# Patient Record
Sex: Male | Born: 1975
Health system: Southern US, Community
[De-identification: ages and names within clinical notes are randomized; demographics above are authoritative.]

## PROBLEM LIST (undated history)

## (undated) DIAGNOSIS — E079 Disorder of thyroid, unspecified: Secondary | ICD-10-CM

## (undated) DIAGNOSIS — I10 Essential (primary) hypertension: Secondary | ICD-10-CM

## (undated) HISTORY — DX: Essential (primary) hypertension: I10

## (undated) HISTORY — PX: OTHER SURGICAL HISTORY: SHX169

## (undated) HISTORY — DX: Disorder of thyroid, unspecified: E07.9

---

## 1998-02-27 ENCOUNTER — Emergency Department (HOSPITAL_COMMUNITY): Admission: EM | Admit: 1998-02-27 | Discharge: 1998-02-28 | Payer: Self-pay

## 1999-01-15 ENCOUNTER — Emergency Department (HOSPITAL_COMMUNITY): Admission: EM | Admit: 1999-01-15 | Discharge: 1999-01-15 | Payer: Self-pay | Admitting: Internal Medicine

## 1999-03-11 ENCOUNTER — Emergency Department (HOSPITAL_COMMUNITY): Admission: EM | Admit: 1999-03-11 | Discharge: 1999-03-11 | Payer: Self-pay

## 2001-11-27 ENCOUNTER — Encounter: Admission: RE | Admit: 2001-11-27 | Discharge: 2001-11-27 | Payer: Self-pay | Admitting: Family Medicine

## 2001-11-27 ENCOUNTER — Encounter: Payer: Self-pay | Admitting: Family Medicine

## 2004-07-18 ENCOUNTER — Encounter: Admission: RE | Admit: 2004-07-18 | Discharge: 2004-07-18 | Payer: Self-pay | Admitting: Family Medicine

## 2007-07-25 ENCOUNTER — Emergency Department (HOSPITAL_COMMUNITY): Admission: EM | Admit: 2007-07-25 | Discharge: 2007-07-25 | Payer: Self-pay | Admitting: Emergency Medicine

## 2008-09-06 ENCOUNTER — Emergency Department (HOSPITAL_COMMUNITY): Admission: EM | Admit: 2008-09-06 | Discharge: 2008-09-06 | Payer: Self-pay | Admitting: Emergency Medicine

## 2009-01-30 ENCOUNTER — Emergency Department (HOSPITAL_COMMUNITY): Admission: EM | Admit: 2009-01-30 | Discharge: 2009-01-30 | Payer: Self-pay | Admitting: Emergency Medicine

## 2010-01-08 ENCOUNTER — Encounter: Admission: RE | Admit: 2010-01-08 | Discharge: 2010-01-08 | Payer: Self-pay | Admitting: Internal Medicine

## 2010-02-08 ENCOUNTER — Encounter: Payer: Self-pay | Admitting: Sports Medicine

## 2010-02-27 ENCOUNTER — Ambulatory Visit: Payer: Self-pay | Admitting: Family Medicine

## 2010-02-27 DIAGNOSIS — M25569 Pain in unspecified knee: Secondary | ICD-10-CM

## 2010-02-27 DIAGNOSIS — G90529 Complex regional pain syndrome I of unspecified lower limb: Secondary | ICD-10-CM | POA: Insufficient documentation

## 2010-03-30 ENCOUNTER — Ambulatory Visit: Payer: Self-pay | Admitting: Family Medicine

## 2010-03-30 DIAGNOSIS — IMO0002 Reserved for concepts with insufficient information to code with codable children: Secondary | ICD-10-CM

## 2010-08-14 NOTE — Assessment & Plan Note (Signed)
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 he still has pain medially below knee cap. Is not keeping him from doing anything--can run etc without having to stop. Does have pretty contstant pain at this specific site. No worse with activity.  Also has some tingling sensations at times in his B lower legs, R>L, Especially bad if he stretches with his foot on wall (hamstring stretch0--thn it seems to go almost totally numvb--resolves when he stops. No new injuries.  Current Medications (verified): 1)  Amitriptyline Hcl 50 Mg Tabs (Amitriptyline Hcl) .Marland Kitchen.. 1 By Mouth At Bedtime  Allergies (verified): No Known Drug Allergies  Review of Systems  The patient denies anorexia, fever, weight loss, and weight gain.         Please see HPI for additional ROS.   Physical Exam  General:  alert and well-developed.   Msk:  Rigt quad normal bulk muscle and tone--strength in  knee extesnion 5/5. No knee effusion. Ligamentously intact. No patellar apprehension. TTP anserine bursa area and palpation here reproduces his pain. Negative mcMurray  [place]orea (anserine bursa on right is enlarged, fluid filled, one or two small calcification seen).. Compared with anserine bursa site on left which is flat, minimla fluid. Additional Exam:  Patient given informed consent for injection. Discussed possible complications of infection, bleeding or skin atrophy at site of injection. Possible side effect of avascular necro1-- cc kennalog plus 1----cc 1% lidocaine without epinephrine was injected into the-rigt answerine bursa--. Patient tolerated procedure well with no complications.    Impression & Recommendations:  Problem # 1:   ANSERINE BURSITIS, RIGHT (ICD-726.61)  Orders: Joint Aspirate / Injection, Small (45409) Kenalog 10 mg inj (J3301) rtc 2 weeks no restrictions if not resolved them, consider further imaging  Complete Medication List: 1)  Amitriptyline Hcl 50 Mg Tabs (Amitriptyline hcl) .Marland Kitchen.. 1 by mouth at bedtime

## 2010-08-14 NOTE — Letter (Signed)
Summary: [facility]  [place] [facility]   Imported By: Marily Memos 02/12/2010 10:16:31  _____________________________________________________________________  External Attachment:    Type:   Image     Comment:   External Document
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
## 2010-11-23 ENCOUNTER — Other Ambulatory Visit: Payer: Self-pay | Admitting: Occupational Medicine

## 2010-11-23 ENCOUNTER — Ambulatory Visit
Admission: RE | Admit: 2010-11-23 | Discharge: 2010-11-23 | Disposition: A | Discharge status: Home / Self Care | Payer: Self-pay | Source: Ambulatory Visit | Attending: Occupational Medicine | Admitting: Occupational Medicine

## 2010-11-23 DIAGNOSIS — S60229A Contusion of unspecified hand, initial encounter: Secondary | ICD-10-CM

## 2010-12-08 ENCOUNTER — Emergency Department (HOSPITAL_COMMUNITY)
Admission: EM | Admit: 2010-12-08 | Discharge: 2010-12-08 | Disposition: A | Discharge status: Home / Self Care | Payer: Worker's Compensation | Attending: Emergency Medicine | Admitting: Emergency Medicine

## 2010-12-08 ENCOUNTER — Emergency Department (HOSPITAL_COMMUNITY): Payer: Worker's Compensation

## 2010-12-08 DIAGNOSIS — W19XXXA Unspecified fall, initial encounter: Secondary | ICD-10-CM | POA: Insufficient documentation

## 2010-12-08 DIAGNOSIS — S46909A Unspecified injury of unspecified muscle, fascia and tendon at shoulder and upper arm level, unspecified arm, initial encounter: Secondary | ICD-10-CM | POA: Insufficient documentation

## 2010-12-08 DIAGNOSIS — Y990 Civilian activity done for income or pay: Secondary | ICD-10-CM | POA: Insufficient documentation

## 2010-12-08 DIAGNOSIS — S4980XA Other specified injuries of shoulder and upper arm, unspecified arm, initial encounter: Secondary | ICD-10-CM | POA: Insufficient documentation

## 2010-12-08 DIAGNOSIS — E039 Hypothyroidism, unspecified: Secondary | ICD-10-CM | POA: Insufficient documentation

## 2010-12-08 DIAGNOSIS — Z79899 Other long term (current) drug therapy: Secondary | ICD-10-CM | POA: Insufficient documentation

## 2010-12-08 DIAGNOSIS — IMO0002 Reserved for concepts with insufficient information to code with codable children: Secondary | ICD-10-CM | POA: Insufficient documentation

## 2010-12-08 DIAGNOSIS — M25519 Pain in unspecified shoulder: Secondary | ICD-10-CM | POA: Insufficient documentation

## 2010-12-26 ENCOUNTER — Ambulatory Visit (INDEPENDENT_AMBULATORY_CARE_PROVIDER_SITE_OTHER): Payer: Worker's Compensation | Admitting: Family Medicine

## 2010-12-26 ENCOUNTER — Encounter: Payer: Self-pay | Admitting: Family Medicine

## 2010-12-26 VITALS — BP 130/84 | Ht 71.0 in | Wt 182.0 lb

## 2010-12-26 DIAGNOSIS — M25519 Pain in unspecified shoulder: Secondary | ICD-10-CM

## 2010-12-26 DIAGNOSIS — M25511 Pain in right shoulder: Secondary | ICD-10-CM

## 2010-12-26 MED ORDER — MELOXICAM 15 MG PO TABS: 15.0000 mg | ORAL_TABLET | Freq: Every day | ORAL | Status: AC

## 2010-12-27 ENCOUNTER — Ambulatory Visit (HOSPITAL_COMMUNITY)
Admission: RE | Admit: 2010-12-27 | Discharge: 2010-12-27 | Disposition: A | Discharge status: Home / Self Care | Payer: Worker's Compensation | Source: Ambulatory Visit | Attending: Family Medicine | Admitting: Family Medicine

## 2010-12-27 ENCOUNTER — Other Ambulatory Visit: Payer: Self-pay | Admitting: Family Medicine

## 2010-12-27 DIAGNOSIS — M25511 Pain in right shoulder: Secondary | ICD-10-CM

## 2010-12-27 DIAGNOSIS — R0789 Other chest pain: Secondary | ICD-10-CM | POA: Insufficient documentation

## 2010-12-27 DIAGNOSIS — M25519 Pain in unspecified shoulder: Secondary | ICD-10-CM | POA: Insufficient documentation
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
## 2011-01-14 ENCOUNTER — Ambulatory Visit (INDEPENDENT_AMBULATORY_CARE_PROVIDER_SITE_OTHER): Payer: Worker's Compensation | Admitting: Sports Medicine

## 2011-01-14 ENCOUNTER — Encounter: Payer: Self-pay | Admitting: Sports Medicine

## 2011-01-14 VITALS — BP 135/90 | HR 56 | Ht 71.0 in | Wt 175.0 lb

## 2011-01-14 DIAGNOSIS — M7521 Bicipital tendinitis, right shoulder: Secondary | ICD-10-CM | POA: Insufficient documentation

## 2011-01-14 DIAGNOSIS — M7520 Bicipital tendinitis, unspecified shoulder: Secondary | ICD-10-CM

## 2011-01-14 DIAGNOSIS — M25511 Pain in right shoulder: Secondary | ICD-10-CM

## 2011-01-14 DIAGNOSIS — M25519 Pain in unspecified shoulder: Secondary | ICD-10-CM

## 2011-01-14 NOTE — Assessment & Plan Note (Signed)
I think he is making excellent progress and is now off of any regular medications. His pain is relieved with ibuprofen I would ask him to continue physical therapy He is to remain on light duty but should go ahead and test summary work-related activities to see if they create any pain  I would like to recheck him in 6 weeks and at that point I would hope he is ready to return to full activity

## 2011-01-14 NOTE — Assessment & Plan Note (Signed)
There appears to be some element of bicipital tendinitis with a positive exam and with some edema on his scan  I would wonder if this has been compensatory as it does not seem that the top of fall would create this injury  This is mild but I would like him to at some light biceps curls, straight arm raises and forearm rotations to see if he can get this to resolve faster  Copies of this note to Dr. Kathrine Haddock at occupational health and to [facility]
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
## 2011-01-14 NOTE — Patient Instructions (Signed)
Continue Physical therapy  Follow up in 6 weeks  Try normal motions that you would do at work to determine readiness to return to full duty before next appointment.

## 2011-02-25 ENCOUNTER — Ambulatory Visit (INDEPENDENT_AMBULATORY_CARE_PROVIDER_SITE_OTHER): Payer: Worker's Compensation | Admitting: Sports Medicine

## 2011-02-25 VITALS — BP 146/92

## 2011-02-25 DIAGNOSIS — M7521 Bicipital tendinitis, right shoulder: Secondary | ICD-10-CM

## 2011-02-25 DIAGNOSIS — M25519 Pain in unspecified shoulder: Secondary | ICD-10-CM

## 2011-02-25 DIAGNOSIS — M7520 Bicipital tendinitis, unspecified shoulder: Secondary | ICD-10-CM

## 2011-02-25 DIAGNOSIS — M25511 Pain in right shoulder: Secondary | ICD-10-CM

## 2011-02-25 NOTE — Progress Notes (Signed)
  Subjective:    Patient ID: Dean Young, male    DOB: [birth year], 35 y.o.   MRN: [id number]  HPI 35 y/o male is here for follow up for right shoulder pain following falling on his shoulder from 6 feet.  His pain is mostly resolved.  He has been back to full duty for 2 weeks.  He was released from physical therapy and is now working out in the gym twice weekly.  He doesn't require any medications.   Review of Systems     Objective:   Physical Exam  Right shoulder: Forward flexion to 180, without pain Abduction to 180, without pain Strength in rotator cuff is normal Yergason's and Speeds negative Hawkin's is equivocal   No pain or weakness with modified testing of the biceps  Some weakness but no pain with modified testing of the supraspinatus        Assessment & Plan:

## 2011-02-25 NOTE — Assessment & Plan Note (Addendum)
His pain is mostly resolved.  He has normal ROM.  His strength is normal with the exception of a minus 30% overhead.  We expect this to return to normal in 4-6 weeks without medical intervention.  He will follow up PRN.  He has reached a point of Maximal Medical Improvement from his current injury and is released to full activity.

## 2011-02-25 NOTE — Assessment & Plan Note (Signed)
The pain has resolved and his strength is normal.  He has reached maximal medical improvement and is released to full duty.  His follow up is PRN.

## 2011-02-25 NOTE — Patient Instructions (Signed)
1.  Add some overhead strengthening exercises with dumb bells.  2. You are at maximal medical improvement right now so you are able to continue with full duty.  3. Follow up if your pain returns.

## 2011-05-10 ENCOUNTER — Emergency Department (HOSPITAL_COMMUNITY)
Admission: EM | Admit: 2011-05-10 | Discharge: 2011-05-10 | Disposition: A | Discharge status: Home / Self Care | Payer: Worker's Compensation | Attending: Emergency Medicine | Admitting: Emergency Medicine

## 2011-05-10 ENCOUNTER — Emergency Department (HOSPITAL_COMMUNITY): Payer: Worker's Compensation

## 2011-05-10 DIAGNOSIS — S8000XA Contusion of unspecified knee, initial encounter: Secondary | ICD-10-CM | POA: Insufficient documentation

## 2011-05-10 DIAGNOSIS — Y990 Civilian activity done for income or pay: Secondary | ICD-10-CM | POA: Insufficient documentation

## 2011-05-10 DIAGNOSIS — Z79899 Other long term (current) drug therapy: Secondary | ICD-10-CM | POA: Insufficient documentation

## 2011-05-10 DIAGNOSIS — M25569 Pain in unspecified knee: Secondary | ICD-10-CM | POA: Insufficient documentation

## 2011-05-10 DIAGNOSIS — E039 Hypothyroidism, unspecified: Secondary | ICD-10-CM | POA: Insufficient documentation

## 2011-05-10 DIAGNOSIS — IMO0002 Reserved for concepts with insufficient information to code with codable children: Secondary | ICD-10-CM | POA: Insufficient documentation

## 2011-05-30 ENCOUNTER — Ambulatory Visit (INDEPENDENT_AMBULATORY_CARE_PROVIDER_SITE_OTHER): Payer: Worker's Compensation | Admitting: Family Medicine

## 2011-05-30 VITALS — BP 130/88

## 2011-05-30 DIAGNOSIS — G90529 Complex regional pain syndrome I of unspecified lower limb: Secondary | ICD-10-CM

## 2011-05-30 DIAGNOSIS — M25569 Pain in unspecified knee: Secondary | ICD-10-CM

## 2011-05-30 MED ORDER — AMITRIPTYLINE HCL 25 MG PO TABS: 25.0000 mg | ORAL_TABLET | Freq: Every day | ORAL | Status: AC
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
## 2011-05-30 NOTE — Patient Instructions (Signed)
Our office will be checking with the city about your knee MRI and call you.

## 2011-06-03 ENCOUNTER — Encounter: Payer: Self-pay | Admitting: *Deleted

## 2011-06-03 NOTE — Progress Notes (Signed)
MRI order and office notes faxed to Noralee Chars at the [facility].  Called Liborio Nixon and she states she will schedule appt.

## 2011-07-30 ENCOUNTER — Ambulatory Visit (INDEPENDENT_AMBULATORY_CARE_PROVIDER_SITE_OTHER): Payer: Worker's Compensation | Admitting: Family Medicine

## 2011-07-30 ENCOUNTER — Encounter: Payer: Self-pay | Admitting: Family Medicine

## 2011-07-30 VITALS — BP 133/95 | HR 108

## 2011-07-30 DIAGNOSIS — M25561 Pain in right knee: Secondary | ICD-10-CM

## 2011-07-30 DIAGNOSIS — S83289A Other tear of lateral meniscus, current injury, unspecified knee, initial encounter: Secondary | ICD-10-CM

## 2011-07-30 DIAGNOSIS — M25569 Pain in unspecified knee: Secondary | ICD-10-CM
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
## 2015-01-03 ENCOUNTER — Other Ambulatory Visit: Payer: Self-pay | Admitting: Occupational Medicine

## 2015-01-03 ENCOUNTER — Ambulatory Visit
Admission: RE | Admit: 2015-01-03 | Discharge: 2015-01-03 | Disposition: A | Discharge status: Home / Self Care | Payer: No Typology Code available for payment source | Source: Ambulatory Visit | Attending: Occupational Medicine | Admitting: Occupational Medicine

## 2015-01-03 DIAGNOSIS — Z021 Encounter for pre-employment examination: Secondary | ICD-10-CM

## 2016-11-06 DIAGNOSIS — J301 Allergic rhinitis due to pollen: Secondary | ICD-10-CM | POA: Diagnosis not present

## 2016-11-06 DIAGNOSIS — J3081 Allergic rhinitis due to animal (cat) (dog) hair and dander: Secondary | ICD-10-CM | POA: Diagnosis not present

## 2016-11-06 DIAGNOSIS — J3089 Other allergic rhinitis: Secondary | ICD-10-CM | POA: Diagnosis not present

## 2016-11-18 DIAGNOSIS — E039 Hypothyroidism, unspecified: Secondary | ICD-10-CM | POA: Diagnosis not present

## 2016-11-18 DIAGNOSIS — E7800 Pure hypercholesterolemia, unspecified: Secondary | ICD-10-CM | POA: Diagnosis not present

## 2016-12-12 ENCOUNTER — Emergency Department (HOSPITAL_COMMUNITY)
Admission: EM | Admit: 2016-12-12 | Discharge: 2016-12-12 | Disposition: A | Discharge status: Home / Self Care | Payer: Worker's Compensation | Attending: Emergency Medicine | Admitting: Emergency Medicine

## 2016-12-12 DIAGNOSIS — I10 Essential (primary) hypertension: Secondary | ICD-10-CM

## 2016-12-12 LAB — I-STAT CHEM 8, ED
BUN: 25 mg/dL — ABNORMAL HIGH (ref 6–20)
CALCIUM ION: 1.11 mmol/L — AB (ref 1.15–1.40)
Chloride: 105 mmol/L (ref 101–111)
Creatinine, Ser: 1.2 mg/dL (ref 0.61–1.24)
Glucose, Bld: 85 mg/dL (ref 65–99)
HCT: 37 % — ABNORMAL LOW (ref 39.0–52.0)
HEMOGLOBIN: 12.6 g/dL — AB (ref 13.0–17.0)
POTASSIUM: 3.4 mmol/L — AB (ref 3.5–5.1)
SODIUM: 140 mmol/L (ref 135–145)
TCO2: 26 mmol/L (ref 0–100)

## 2016-12-12 NOTE — ED Provider Notes (Signed)
[facility]-EMERGENCY DEPT Provider Note   CSN: [id number] Arrival date & time: 12/12/16  1919  By signing my name below, I, Modena Jansky, attest that this documentation has been prepared under the direction and in the presence of Linwood Dibbles, MD. Electronically Signed: Modena Jansky, Scribe. 12/12/2016. 7:57 PM.  History   Chief Complaint Chief Complaint  Patient presents with  . Hypertension   The history is provided by the patient. No language interpreter was used.    HPI Comments: Dean Young is a 41 y.o. male who presents to the Emergency Department complaining of HTN that started today. He states he had a reading of 200/120 today. His blood pressure in the ED today was 133/100. No modifying factors. He has a family hx of HTN. Denies any chest pain, SOB, vomiting, or other complaints at this time.  No past medical history on file.  Patient Active Problem List   Diagnosis Date Noted  . Bicipital tendinitis of right shoulder 01/14/2011  . Shoulder pain, right 12/26/2010  . ANSERINE BURSITIS, RIGHT 03/30/2010  . REFLEX SYMPATHETIC DYSTROPHY OF THE LOWER LIMB 02/27/2010  . KNEE PAIN, RIGHT 02/27/2010    No past surgical history on file.     Home Medications    Prior to Admission medications   Medication Sig Start Date End Date Taking? Authorizing Provider  amitriptyline (ELAVIL) 25 MG tablet Take 1 tablet (25 mg total) by mouth at bedtime. 05/30/11 05/29/12  Hannah Beat, MD  EPIPEN 2-PAK 0.3 MG/0.3ML DEVI  11/09/10   [provider]  levocetirizine (XYZAL) 5 MG tablet Take 5 mg by mouth daily. 12/26/10   [provider]  levothyroxine (SYNTHROID, LEVOTHROID) 100 MCG tablet Take 100 mcg by mouth daily. 10/26/10   [provider]  VYVANSE 30 MG capsule Take 30 mg by mouth daily. 12/10/10   [provider]    Family History No family history on file.  Social History Social History  Substance Use Topics  . Smoking status: Never Smoker   . Smokeless tobacco: Never Used  . Alcohol use Not on file     Allergies   Patient has no known allergies.   Review of Systems Review of Systems  Respiratory: Negative for shortness of breath.   Cardiovascular: Negative for chest pain.  Gastrointestinal: Negative for vomiting.     Physical Exam Updated Vital Signs BP (!) 133/100   Pulse 72   Temp 98.4 F (36.9 C)   Resp 18   SpO2 97%   Physical Exam  Constitutional: He appears well-developed and well-nourished. No distress.  HENT:  Head: Normocephalic and atraumatic.  Right Ear: External ear normal.  Left Ear: External ear normal.  Eyes: Conjunctivae are normal. Right eye exhibits no discharge. Left eye exhibits no discharge. No scleral icterus.  Neck: Neck supple. No tracheal deviation present.  Cardiovascular: Normal rate.   Pulmonary/Chest: Effort normal. No stridor. No respiratory distress.  Abdominal: He exhibits no distension.  Musculoskeletal: He exhibits no edema.  Neurological: He is alert. Cranial nerve deficit: no gross deficits.  Skin: Skin is warm and dry. No rash noted.  Psychiatric: He has a normal mood and affect.  Nursing note and vitals reviewed.    ED Treatments / Results  DIAGNOSTIC STUDIES: Oxygen Saturation is 97% on RA, normal by my interpretation.    COORDINATION OF CARE: 8:01 PM- Pt advised of plan for treatment and pt agrees.  Labs (all labs ordered are listed, but only abnormal results are displayed) Labs Reviewed  I-STAT CHEM 8, ED - Abnormal; Notable for the following:       Result Value   Potassium 3.4 (*)    BUN 25 (*)    Calcium, Ion 1.11 (*)    Hemoglobin 12.6 (*)    HCT 37.0 (*)    All other components within normal limits    EKG  EKG Interpretation  Date/Time:  Thursday Dec 12 2016 20:12:11 EDT Ventricular Rate:  64 PR Interval:  144 QRS Duration: 110 QT Interval:  426 QTC Calculation: 439 R Axis:   27 Text Interpretation:  Normal sinus rhythm Normal ECG  No old tracing to compare Confirmed by Linwood DibblesKnapp, Joshawn Crissman [phone] on 12/12/2016 8:19:04 PM        Procedures Procedures (including critical care time)  Medications Ordered in ED Medications - No data to display   Initial Impression / Assessment and Plan / ED Course  I have reviewed the triage vital signs and the nursing notes.  Pertinent labs & imaging results that were available during my care of the patient were reviewed by me and considered in my medical decision making (see chart for details).   patient presented to the emergency room for evaluation of hypertension. Patient was at work today when a paramedic took his blood pressure and it was 170/110. Patient denies having any particular stress or anxiety at that time. He was asymptomatic. He denied any trouble with chest pain or shortness of breath. In the emergency room his blood pressure was much better at initially 133/100. Repeat bilateral blood pressures show 147/93 and 138/91. I don't think he needs to start any medical treatment at this time. I recommend he start taking his blood pressure regularly at least a few times a week and keeping a record of it. I asked him to make a follow-up appointment with his primary care doctor. Warning signs and precautions were discussed.  Final Clinical Impressions(s) / ED Diagnoses   Final diagnoses:  Hypertension, unspecified type    New Prescriptions New Prescriptions   No medications on file  I personally performed the services described in this documentation, which was scribed in my presence.  The recorded information has been reviewed and is accurate.    Linwood DibblesKnapp, Trinh Sanjose, MD 12/12/16 2101

## 2016-12-12 NOTE — Discharge Instructions (Signed)
Start taking your blood pressure a few times per week.  Keep a record of this and follow up with your primary care doctor to review the numbers.  Return to the ED for chest pain, shortness of breath or other concerning symptoms

## 2016-12-12 NOTE — ED Triage Notes (Signed)
Pt presents with EMS; pt states he was checking Bp periodically and today BP was 170/110; pt denies hx of HTN but states it  Runs in family; Pt denies any sx or pan on arrival ;pt a&ox 4

## 2016-12-13 DIAGNOSIS — I10 Essential (primary) hypertension: Secondary | ICD-10-CM | POA: Diagnosis not present

## 2017-01-21 DIAGNOSIS — I10 Essential (primary) hypertension: Secondary | ICD-10-CM | POA: Diagnosis not present

## 2017-12-03 DIAGNOSIS — J301 Allergic rhinitis due to pollen: Secondary | ICD-10-CM | POA: Diagnosis not present

## 2017-12-03 DIAGNOSIS — J3081 Allergic rhinitis due to animal (cat) (dog) hair and dander: Secondary | ICD-10-CM | POA: Diagnosis not present

## 2017-12-03 DIAGNOSIS — J3089 Other allergic rhinitis: Secondary | ICD-10-CM | POA: Diagnosis not present

## 2017-12-23 DIAGNOSIS — I10 Essential (primary) hypertension: Secondary | ICD-10-CM | POA: Diagnosis not present

## 2017-12-23 DIAGNOSIS — E7800 Pure hypercholesterolemia, unspecified: Secondary | ICD-10-CM | POA: Diagnosis not present

## 2017-12-23 DIAGNOSIS — E039 Hypothyroidism, unspecified: Secondary | ICD-10-CM | POA: Diagnosis not present

## 2018-03-31 DIAGNOSIS — I83812 Varicose veins of left lower extremities with pain: Secondary | ICD-10-CM | POA: Diagnosis not present

## 2018-03-31 DIAGNOSIS — Z6827 Body mass index (BMI) 27.0-27.9, adult: Secondary | ICD-10-CM | POA: Diagnosis not present

## 2018-04-08 ENCOUNTER — Other Ambulatory Visit: Payer: Self-pay

## 2018-04-08 DIAGNOSIS — I83892 Varicose veins of left lower extremities with other complications: Secondary | ICD-10-CM

## 2018-04-13 ENCOUNTER — Other Ambulatory Visit: Payer: Self-pay

## 2018-04-23 ENCOUNTER — Ambulatory Visit (INDEPENDENT_AMBULATORY_CARE_PROVIDER_SITE_OTHER): Payer: 59 | Admitting: Vascular Surgery

## 2018-04-23 ENCOUNTER — Encounter: Payer: Self-pay | Admitting: Vascular Surgery

## 2018-04-23 ENCOUNTER — Ambulatory Visit (HOSPITAL_COMMUNITY)
Admission: RE | Admit: 2018-04-23 | Discharge: 2018-04-23 | Disposition: A | Discharge status: Home / Self Care | Payer: 59 | Source: Ambulatory Visit | Attending: Vascular Surgery | Admitting: Vascular Surgery

## 2018-04-23 VITALS — BP 128/85 | HR 57 | Temp 98.0°F | Resp 16 | Ht 71.0 in | Wt 195.0 lb

## 2018-04-23 DIAGNOSIS — I83812 Varicose veins of left lower extremities with pain: Secondary | ICD-10-CM | POA: Diagnosis not present

## 2018-04-23 DIAGNOSIS — I83892 Varicose veins of left lower extremities with other complications: Secondary | ICD-10-CM | POA: Diagnosis not present

## 2018-04-23 NOTE — Progress Notes (Signed)
REASON FOR CONSULT:    Painful varicose veins of the left lower extremity.  The consult is requested by Dr. Tenny Craw.  HPI:   Dean Young is a pleasant 42 y.o. male, who is referred for evaluation of a painful varicose vein in the left lower extremity.  I have reviewed the records that was sent with the patient.  The patient has previously been seen by 1 of the other vein centers in 2015 with chronic venous insufficiency.  Patient was most recently seen by Dr. Hyacinth Meeker on 03/31/2018.  He had some painful varicose veins of the left lower extremity and is set up for vascular consultation.  The patient describes some aching pain and heaviness associated with the varicose veins along the anterior lateral aspect of his left leg.  Symptoms are aggravated by standing and sitting and relieved with elevation.  He does not wear compression stockings.  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  He is unaware of any previous history of DVT or phlebitis.  Past Medical History:  Diagnosis Date  . Hypertension   . Thyroid disease     Family History  Problem Relation Age of Onset  . Heart disease Mother   . Hyperlipidemia Mother   . Hypertension Mother   . Obesity Mother   . Cancer Sister     SOCIAL HISTORY: Social History   Socioeconomic History  . Marital status: Married    Spouse name: Not on file  . Number of children: Not on file  . Years of education: Not on file  . Highest education level: Not on file  Occupational History  . Not on file  Social Needs  . Financial resource strain: Not on file  . Food insecurity:    Worry: Not on file    Inability: Not on file  . Transportation needs:    Medical: Not on file    Non-medical: Not on file  Tobacco Use  . Smoking status: Never Smoker  . Smokeless tobacco: Never Used  Substance and Sexual Activity  . Alcohol use: Yes    Alcohol/week: 4.0 standard drinks    Types: 2 Cans of beer, 2 Shots of liquor per week  .  Drug use: Never  . Sexual activity: Yes  Lifestyle  . Physical activity:    Days per week: Not on file    Minutes per session: Not on file  . Stress: Not on file  Relationships  . Social connections:    Talks on phone: Not on file    Gets together: Not on file    Attends religious service: Not on file    Active member of club or organization: Not on file    Attends meetings of clubs or organizations: Not on file    Relationship status: Not on file  . Intimate partner violence:    Fear of current or ex partner: Not on file    Emotionally abused: Not on file    Physically abused: Not on file    Forced sexual activity: Not on file  Other Topics Concern  . Not on file  Social History Narrative  . Not on file    No Known Allergies  Current Outpatient Medications  Medication Sig Dispense Refill  . EPIPEN 2-PAK 0.3 MG/0.3ML DEVI     . levocetirizine (XYZAL) 5 MG tablet Take 5 mg by mouth daily.    Marland Kitchen levothyroxine (SYNTHROID, LEVOTHROID) 100 MCG tablet Take 100 mcg by mouth daily.    Marland Kitchen  amitriptyline (ELAVIL) 25 MG tablet Take 1 tablet (25 mg total) by mouth at bedtime. 30 tablet 3  . VYVANSE 30 MG capsule Take 30 mg by mouth daily.     No current facility-administered medications for this visit.     REVIEW OF SYSTEMS:  [X]  denotes positive finding, [ ]  denotes negative finding Cardiac  Comments:  Chest pain or chest pressure:    Shortness of breath upon exertion:    Short of breath when lying flat:    Irregular heart rhythm:        Vascular    Pain in calf, thigh, or hip brought on by ambulation: x   Pain in feet at night that wakes you up from your sleep:     Blood clot in your veins:    Leg swelling:         Pulmonary    Oxygen at home:    Productive cough:     Wheezing:         Neurologic    Sudden weakness in arms or legs:     Sudden numbness in arms or legs:     Sudden onset of difficulty speaking or slurred speech:    Temporary loss of vision in one eye:      Problems with dizziness:         Gastrointestinal    Blood in stool:     Vomited blood:         Genitourinary    Burning when urinating:     Blood in urine:        Psychiatric    Major depression:         Hematologic    Bleeding problems:    Problems with blood clotting too easily:        Skin    Rashes or ulcers:        Constitutional    Fever or chills:     PHYSICAL EXAM:   Vitals:   04/23/18 1450  BP: 128/85  Pulse: (!) 57  Resp: 16  Temp: 98 F (36.7 C)  SpO2: 99%  Weight: 195 lb (88.5 kg)  Height: 5\' 11"  (1.803 m)   GENERAL: The patient is a well-nourished male, in no acute distress. The vital signs are documented above. CARDIAC: There is a regular rate and rhythm.  VASCULAR: I do not detect carotid bruits. He has palpable femoral and pedal pulses bilaterally. He has no significant lower extremity swelling.  He has 1 varicose vein along the anterior lateral aspect of his left leg likely where there is an underlying perforator that penetrates the fascia.  There is no evidence of phlebitis. PULMONARY: There is good air exchange bilaterally without wheezing or rales. ABDOMEN: Soft and non-tender with normal pitched bowel sounds.  MUSCULOSKELETAL: There are no major deformities or cyanosis. NEUROLOGIC: No focal weakness or paresthesias are detected. SKIN: There are no ulcers or rashes noted. PSYCHIATRIC: The patient has a normal affect.  DATA:    VENOUS DUPLEX: I have independently interpreted the venous duplex scan that was done today.  On the left side there is no evidence of DVT or superficial thrombophlebitis.  There is some deep venous reflux in the common femoral vein.  There is some superficial venous reflux in the great saphenous vein in the mid thigh.  ASSESSMENT & PLAN:   PAINFUL VARICOSE VEINS IN THE LEFT LOWER EXTREMITY: This patient has varicose vein on the anterolateral aspect of his left leg which is painful  when he standing or sitting.  He  has some mild reflux in the superficial system involving the left great saphenous vein over a short segment.  He does not need any type of endovenous laser ablation at this point.  I have discussed with him the importance of intermittent leg elevation the proper positioning for this.  I have written him a prescription for knee-high compression stockings with a gradient of 15 to 20 mmHg.  I have encouraged him to avoid prolonged sitting and standing.  We discussed the importance of exercise especially walking.  I will be happy to see him back at any time if his symptoms progress.   Waverly Ferrari [facility] [phone]

## 2018-06-01 ENCOUNTER — Other Ambulatory Visit: Payer: Self-pay

## 2018-06-01 ENCOUNTER — Emergency Department (HOSPITAL_COMMUNITY)
Admission: EM | Admit: 2018-06-01 | Discharge: 2018-06-01 | Disposition: A | Discharge status: Home / Self Care | Payer: No Typology Code available for payment source | Attending: Emergency Medicine | Admitting: Emergency Medicine

## 2018-06-01 ENCOUNTER — Emergency Department (HOSPITAL_COMMUNITY): Payer: No Typology Code available for payment source

## 2018-06-01 ENCOUNTER — Encounter (HOSPITAL_COMMUNITY): Payer: Self-pay | Admitting: Emergency Medicine

## 2018-06-01 DIAGNOSIS — E079 Disorder of thyroid, unspecified: Secondary | ICD-10-CM | POA: Insufficient documentation

## 2018-06-01 DIAGNOSIS — I10 Essential (primary) hypertension: Secondary | ICD-10-CM | POA: Diagnosis not present

## 2018-06-01 DIAGNOSIS — M25561 Pain in right knee: Secondary | ICD-10-CM | POA: Insufficient documentation

## 2018-06-01 MED ORDER — IBUPROFEN 800 MG PO TABS: 800.0000 mg | ORAL_TABLET | Freq: Three times a day (TID) | ORAL | 0 refills | Status: AC

## 2018-06-01 MED ORDER — BACITRACIN ZINC 500 UNIT/GM EX OINT
TOPICAL_OINTMENT | CUTANEOUS | Status: AC
  Administered 2018-06-01: 06:00:00
  Filled 2018-06-01: qty 0.9

## 2018-06-01 MED ORDER — IBUPROFEN 800 MG PO TABS: 800.0000 mg | ORAL_TABLET | Freq: Once | ORAL | Status: DC

## 2018-06-01 MED ORDER — IBUPROFEN 800 MG PO TABS
800.0000 mg | ORAL_TABLET | Freq: Once | ORAL | Status: AC
  Administered 2018-06-01: 800 mg via ORAL
  Filled 2018-06-01 (×2): qty 1

## 2018-06-01 NOTE — ED Notes (Signed)
Ice applied

## 2018-06-01 NOTE — ED Provider Notes (Signed)
[facility]-EMERGENCY DEPT Provider Note   CSN: [id number] Arrival date & time: 06/01/18  0428     History   Chief Complaint Chief Complaint  Patient presents with  . Knee Pain    HPI Dean Young is a 42 y.o. male.  Patient presents to the emergency department with a chief complaint of right knee pain.  Patient states that he was chasing a suspect, when he tripped and fell striking his right knee on a large rock.  Patient complains of pain with movement.  It is worsened with flexion of the knee.  He denies having taken anything for his symptoms.  He has been able to ambulate with a limp.  The history is provided by the patient. No language interpreter was used.    Past Medical History:  Diagnosis Date  . Hypertension   . Thyroid disease     Patient Active Problem List   Diagnosis Date Noted  . Bicipital tendinitis of right shoulder 01/14/2011  . Shoulder pain, right 12/26/2010  . ANSERINE BURSITIS, RIGHT 03/30/2010  . REFLEX SYMPATHETIC DYSTROPHY OF THE LOWER LIMB 02/27/2010  . KNEE PAIN, RIGHT 02/27/2010    Past Surgical History:  Procedure Laterality Date  . orthopedic surgeries Left 1997 and 2015        Home Medications    Prior to Admission medications   Medication Sig Start Date End Date Taking? Authorizing Provider  amitriptyline (ELAVIL) 25 MG tablet Take 1 tablet (25 mg total) by mouth at bedtime. 05/30/11 05/29/12  Hannah Beat, MD  EPIPEN 2-PAK 0.3 MG/0.3ML [facility]  11/09/10   [provider]  ibuprofen (ADVIL,MOTRIN) 800 MG tablet Take 1 tablet (800 mg total) by mouth 3 (three) times daily. 06/01/18   Roxy Horseman, PA-C  levocetirizine (XYZAL) 5 MG tablet Take 5 mg by mouth daily. 12/26/10   [provider]  levothyroxine (SYNTHROID, LEVOTHROID) 100 MCG tablet Take 100 mcg by mouth daily. 10/26/10   [provider]  VYVANSE 30 MG capsule Take 30 mg by mouth daily. 12/10/10   [provider]    Family History Family History  Problem Relation Age of Onset  . Heart disease Mother   . Hyperlipidemia Mother   . Hypertension Mother   . Obesity Mother   . Cancer Sister     Social History Social History   Tobacco Use  . Smoking status: Never Smoker  . Smokeless tobacco: Never Used  Substance Use Topics  . Alcohol use: Yes    Alcohol/week: 4.0 standard drinks    Types: 2 Cans of beer, 2 Shots of liquor per week  . Drug use: Never     Allergies   Patient has no known allergies.   Review of Systems Review of Systems  All other systems reviewed and are negative.    Physical Exam Updated Vital Signs BP (!) 158/102 (BP Location: Left Arm) Comment: pt states hx of HTN  Pulse 76   Temp 97.8 F (36.6 C) (Oral)   Resp 15   Ht 5\' 11"  (1.803 m)   Wt 87.1 kg   SpO2 100%   BMI 26.78 kg/m   Physical Exam Nursing note and vitals reviewed.  Constitutional: Pt appears well-developed and well-nourished. No distress.  HENT:  Head: Normocephalic and atraumatic.  Eyes: Conjunctivae are normal.  Neck: Normal range of motion.  Cardiovascular: Normal rate, regular rhythm. Intact distal pulses.   Capillary refill < 3 sec.  Pulmonary/Chest: Effort normal and breath sounds normal.  Musculoskeletal:  Right knee Pt exhibits tender to palpation over the patella with swelling anteriorly, no bony abnormality or deformity.   ROM: Limited secondary to pain  Strength: Limited secondary to pain Neurological: Pt  is alert. Coordination normal.  Sensation: 5/5 Skin: Skin is warm and dry. Pt is not diaphoretic.  No evidence of open wound or skin tenting Psychiatric: Pt has a normal mood and affect.     ED Treatments / Results  Labs (all labs ordered are listed, but only abnormal results are displayed) Labs Reviewed - No data to display  EKG None  Radiology Dg Knee Complete 4 Views Right  Result Date: 06/01/2018 CLINICAL DATA:  Fall with knee pain EXAM: RIGHT  KNEE - COMPLETE 4+ VIEW COMPARISON:  05/10/2011 FINDINGS: No evidence of fracture, dislocation, or joint effusion. No evidence of arthropathy or other focal bone abnormality. Soft tissues are unremarkable. IMPRESSION: Negative. Electronically Signed   By: Deatra RobinsonKevin  Herman M.D.   On: 06/01/2018 04:51    Procedures Procedures (including critical care time)  Medications Ordered in ED Medications  ibuprofen (ADVIL,MOTRIN) tablet 800 mg (has no administration in time range)     Initial Impression / Assessment and Plan / ED Course  I have reviewed the triage vital signs and the nursing notes.  Pertinent labs & imaging results that were available during my care of the patient were reviewed by me and considered in my medical decision making (see chart for details).    Patient presents with injury to right knee.  DDx includes, fracture, strain, or sprain.  Consultants: None  Plain films reveal no fracture dislocation or effusion.  Pt advised to follow up with PCP and/or orthopedics. Patient given crutches and knee immobilizer while in ED, conservative therapy such as RICE recommended and discussed.   Patient will be discharged home & is agreeable with above plan. Returns precautions discussed. Pt appears safe for discharge.   Final Clinical Impressions(s) / ED Diagnoses   Final diagnoses:  Acute pain of right knee    ED Discharge Orders         Ordered    ibuprofen (ADVIL,MOTRIN) 800 MG tablet  3 times daily     06/01/18 0505           Roxy HorsemanBrowning, Maddalyn Lutze, PA-C 06/01/18 [id number]    Dione BoozeGlick, David, MD 06/01/18 [phone]

## 2018-06-01 NOTE — ED Triage Notes (Signed)
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 Patient ambulatory but limping. Redness, swelling and abrasions noted.

## 2018-07-23 DIAGNOSIS — R0683 Snoring: Secondary | ICD-10-CM | POA: Diagnosis not present

## 2018-07-23 DIAGNOSIS — I10 Essential (primary) hypertension: Secondary | ICD-10-CM | POA: Diagnosis not present

## 2018-07-23 DIAGNOSIS — E039 Hypothyroidism, unspecified: Secondary | ICD-10-CM | POA: Diagnosis not present

## 2018-08-10 DIAGNOSIS — R5382 Chronic fatigue, unspecified: Secondary | ICD-10-CM | POA: Diagnosis not present

## 2018-08-10 DIAGNOSIS — M950 Acquired deformity of nose: Secondary | ICD-10-CM | POA: Diagnosis not present

## 2018-08-10 DIAGNOSIS — R0683 Snoring: Secondary | ICD-10-CM | POA: Diagnosis not present

## 2018-11-04 ENCOUNTER — Other Ambulatory Visit: Payer: Self-pay

## 2018-11-04 ENCOUNTER — Emergency Department (HOSPITAL_COMMUNITY)
Admission: EM | Admit: 2018-11-04 | Discharge: 2018-11-05 | Disposition: A | Discharge status: Home / Self Care | Payer: No Typology Code available for payment source | Attending: Emergency Medicine | Admitting: Emergency Medicine

## 2018-11-04 ENCOUNTER — Encounter (HOSPITAL_COMMUNITY): Payer: Self-pay

## 2018-11-04 DIAGNOSIS — Y9389 Activity, other specified: Secondary | ICD-10-CM | POA: Insufficient documentation

## 2018-11-04 DIAGNOSIS — S40871A Other superficial bite of right upper arm, initial encounter: Secondary | ICD-10-CM | POA: Diagnosis not present

## 2018-11-04 DIAGNOSIS — W540XXA Bitten by dog, initial encounter: Secondary | ICD-10-CM

## 2018-11-04 DIAGNOSIS — S71152A Open bite, left thigh, initial encounter: Secondary | ICD-10-CM | POA: Diagnosis not present

## 2018-11-04 DIAGNOSIS — S79922A Unspecified injury of left thigh, initial encounter: Secondary | ICD-10-CM | POA: Diagnosis present

## 2018-11-04 DIAGNOSIS — Y929 Unspecified place or not applicable: Secondary | ICD-10-CM | POA: Diagnosis not present

## 2018-11-04 DIAGNOSIS — S70372A Other superficial bite of left thigh, initial encounter: Secondary | ICD-10-CM | POA: Diagnosis not present

## 2018-11-04 DIAGNOSIS — Z23 Encounter for immunization: Secondary | ICD-10-CM

## 2018-11-04 DIAGNOSIS — Z79899 Other long term (current) drug therapy: Secondary | ICD-10-CM | POA: Diagnosis not present

## 2018-11-04 DIAGNOSIS — I10 Essential (primary) hypertension: Secondary | ICD-10-CM | POA: Insufficient documentation

## 2018-11-04 DIAGNOSIS — Y990 Civilian activity done for income or pay: Secondary | ICD-10-CM | POA: Diagnosis not present

## 2018-11-04 MED ORDER — BACITRACIN ZINC 500 UNIT/GM EX OINT
TOPICAL_OINTMENT | Freq: Two times a day (BID) | CUTANEOUS | Status: DC
  Administered 2018-11-05: 1 via TOPICAL
  Filled 2018-11-04: qty 1.8

## 2018-11-04 MED ORDER — IBUPROFEN 800 MG PO TABS
800.0000 mg | ORAL_TABLET | Freq: Once | ORAL | Status: AC
  Administered 2018-11-05: 800 mg via ORAL
  Filled 2018-11-04: qty 1

## 2018-11-04 MED ORDER — TETANUS-DIPHTH-ACELL PERTUSSIS 5-2.5-18.5 LF-MCG/0.5 IM SUSP
0.5000 mL | Freq: Once | INTRAMUSCULAR | Status: AC
  Administered 2018-11-05: 0.5 mL via INTRAMUSCULAR
  Filled 2018-11-04: qty 0.5

## 2018-11-04 MED ORDER — AMOXICILLIN-POT CLAVULANATE 875-125 MG PO TABS
1.0000 | ORAL_TABLET | Freq: Once | ORAL | Status: AC
  Administered 2018-11-05: 1 via ORAL
  Filled 2018-11-04: qty 1

## 2018-11-04 MED ORDER — AMOXICILLIN-POT CLAVULANATE 875-125 MG PO TABS: 1.0000 | ORAL_TABLET | Freq: Two times a day (BID) | ORAL | 0 refills | Status: AC

## 2018-11-04 NOTE — ED Triage Notes (Signed)
Pt bit by a dog at work. L inner thigh and R forearm. Dog up to date on shots and known to patient.

## 2018-11-04 NOTE — Discharge Instructions (Addendum)
Take Augmentin twice daily for 5 days. Take Motrin/Tylenol as needed for pain as directed. Wound recheck in 2 days with employee health. Recheck sooner for any concerns. Clean wounds with mild soapy water (Dove soap) and apply Bacitracin daily.

## 2018-11-05 LAB — RAPID HIV SCREEN (HIV 1/2 AB+AG)
HIV 1/2 Antibodies: NONREACTIVE
HIV-1 P24 Antigen - HIV24: NONREACTIVE
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
## 2018-11-06 LAB — HEPATITIS B SURFACE ANTIGEN: Hepatitis B Surface Ag: NEGATIVE

## 2018-11-06 LAB — HEPATITIS C ANTIBODY: HCV Ab: <0.1 s/co ratio (ref 0.0–0.9)

## 2020-06-02 ENCOUNTER — Emergency Department (HOSPITAL_COMMUNITY)
Admission: EM | Admit: 2020-06-02 | Discharge: 2020-06-02 | Disposition: A | Discharge status: Home / Self Care | Payer: No Typology Code available for payment source | Attending: Emergency Medicine | Admitting: Emergency Medicine

## 2020-06-02 DIAGNOSIS — E039 Hypothyroidism, unspecified: Secondary | ICD-10-CM | POA: Diagnosis not present

## 2020-06-02 DIAGNOSIS — Z0289 Encounter for other administrative examinations: Secondary | ICD-10-CM | POA: Diagnosis not present

## 2020-06-02 DIAGNOSIS — I10 Essential (primary) hypertension: Secondary | ICD-10-CM | POA: Diagnosis not present

## 2020-06-02 DIAGNOSIS — Z0000 Encounter for general adult medical examination without abnormal findings: Secondary | ICD-10-CM
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
## 2020-06-03 LAB — RAPID URINE DRUG SCREEN, HOSP PERFORMED
Amphetamines: NOT DETECTED
Barbiturates: NOT DETECTED
Benzodiazepines: NOT DETECTED
Cocaine: NOT DETECTED
Opiates: NOT DETECTED
Tetrahydrocannabinol: NOT DETECTED

## 2020-11-01 IMAGING — CR DG KNEE COMPLETE 4+V*R*
4 series · 4 of 4 positions shown · non-contrast
Comparison: 05/10/2011

CLINICAL DATA: Fall with knee pain

EXAM:
RIGHT KNEE - COMPLETE 4+ VIEW

[t knee ap right]
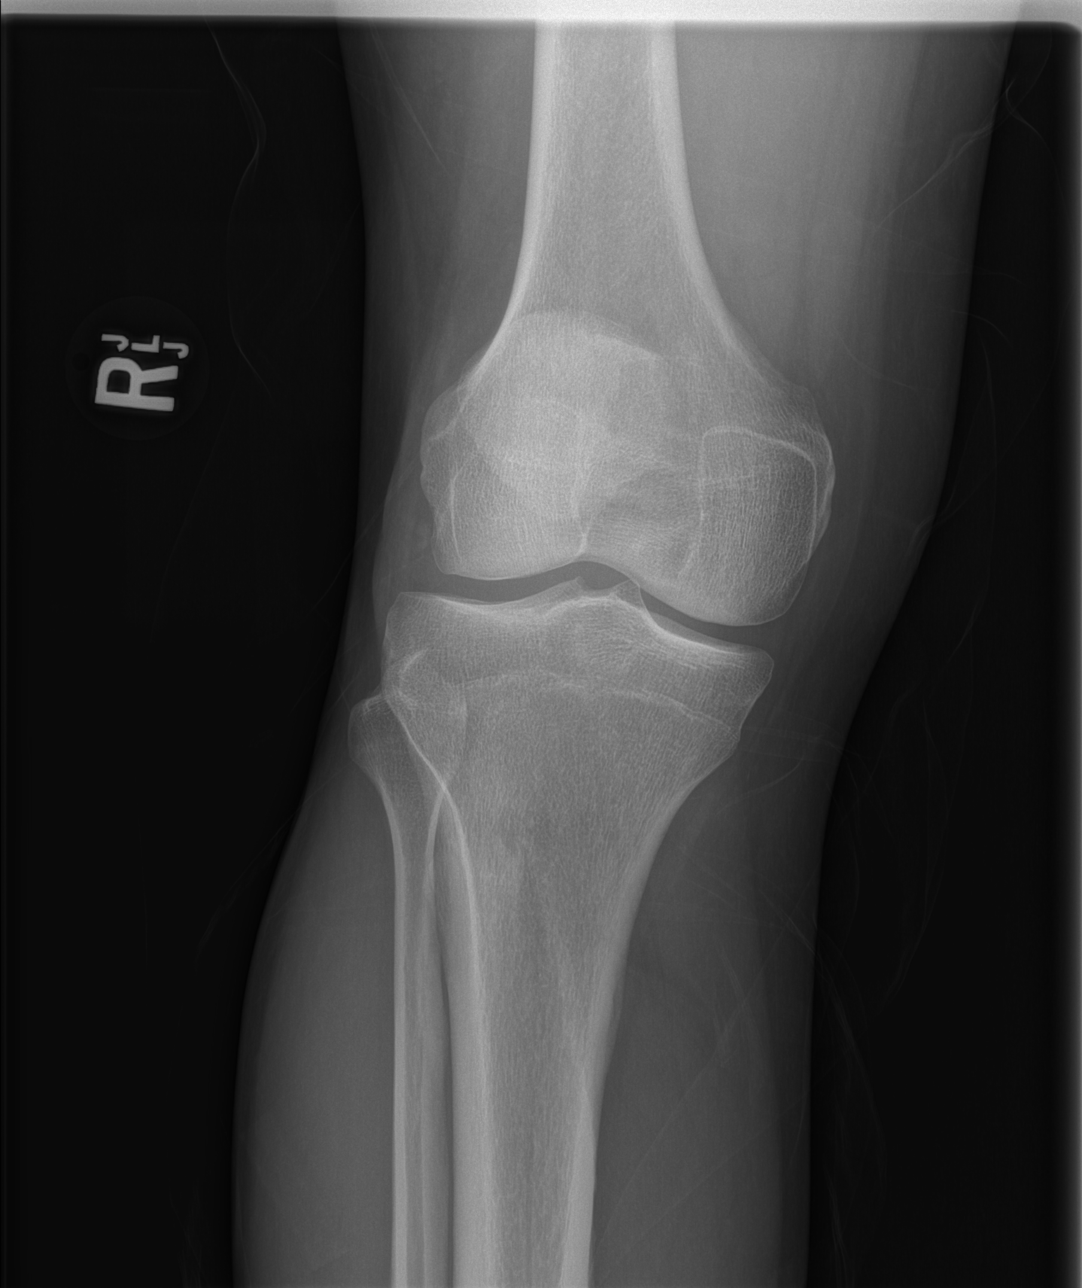

[t knee obl right (1 of 2)]
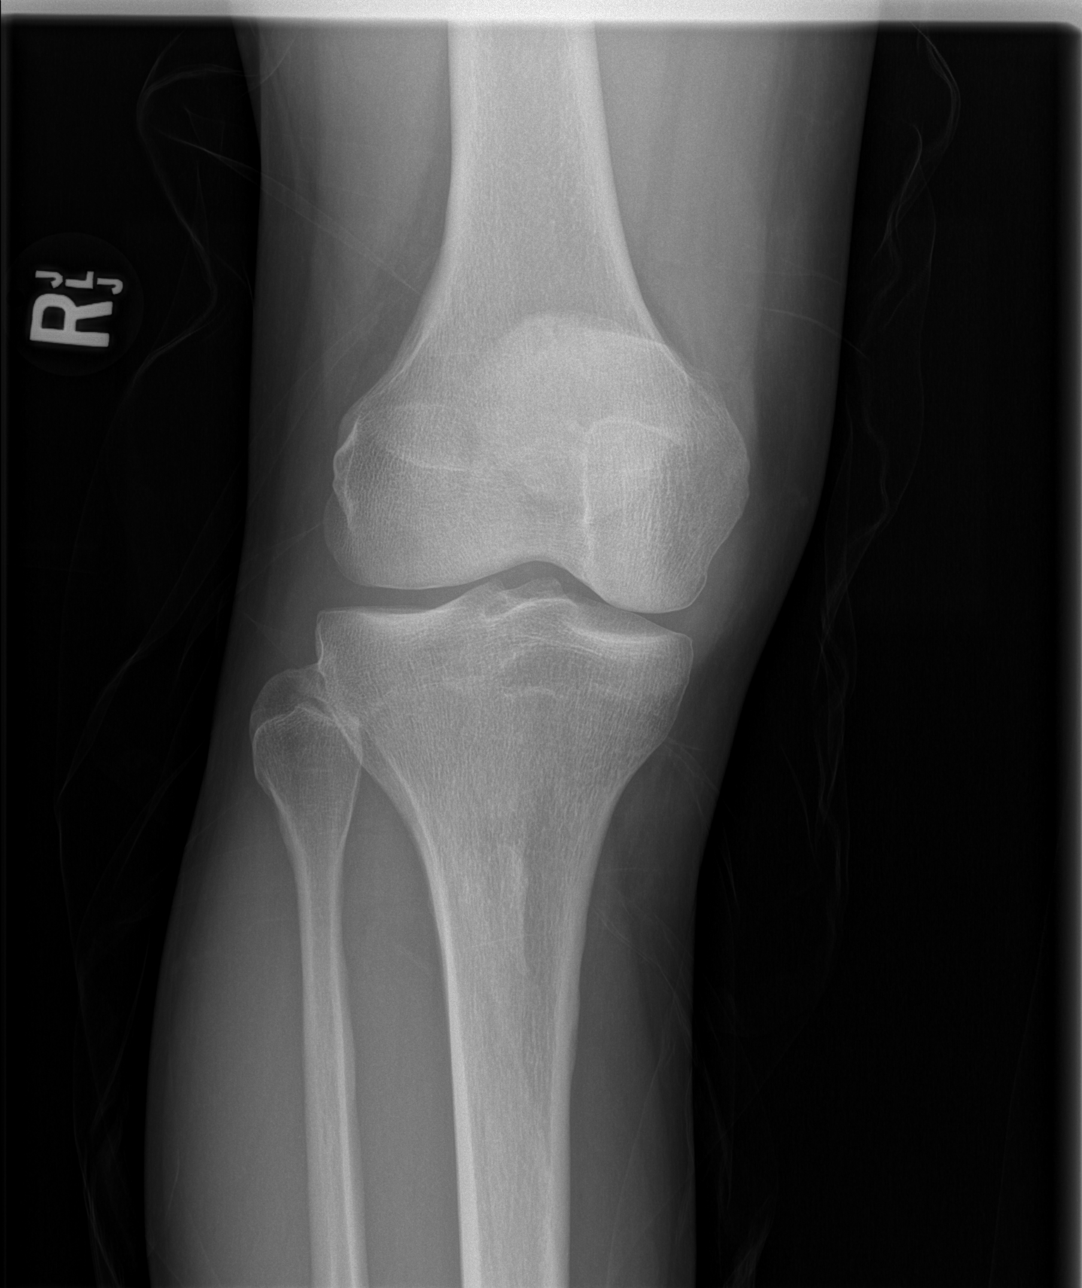

[t knee obl right (2 of 2)]
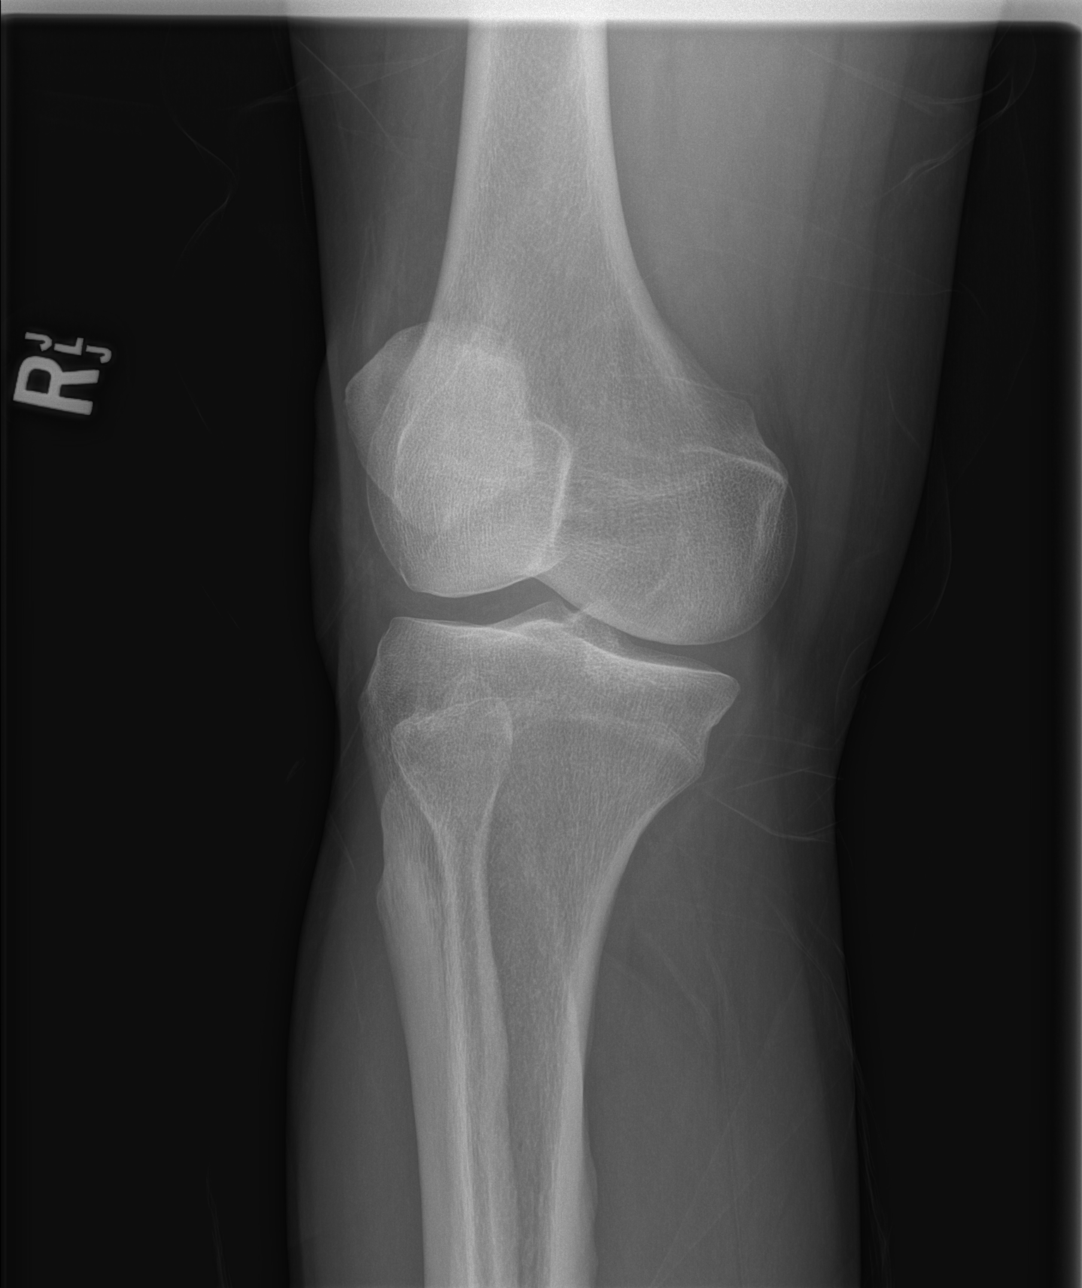

[t knee lat right]
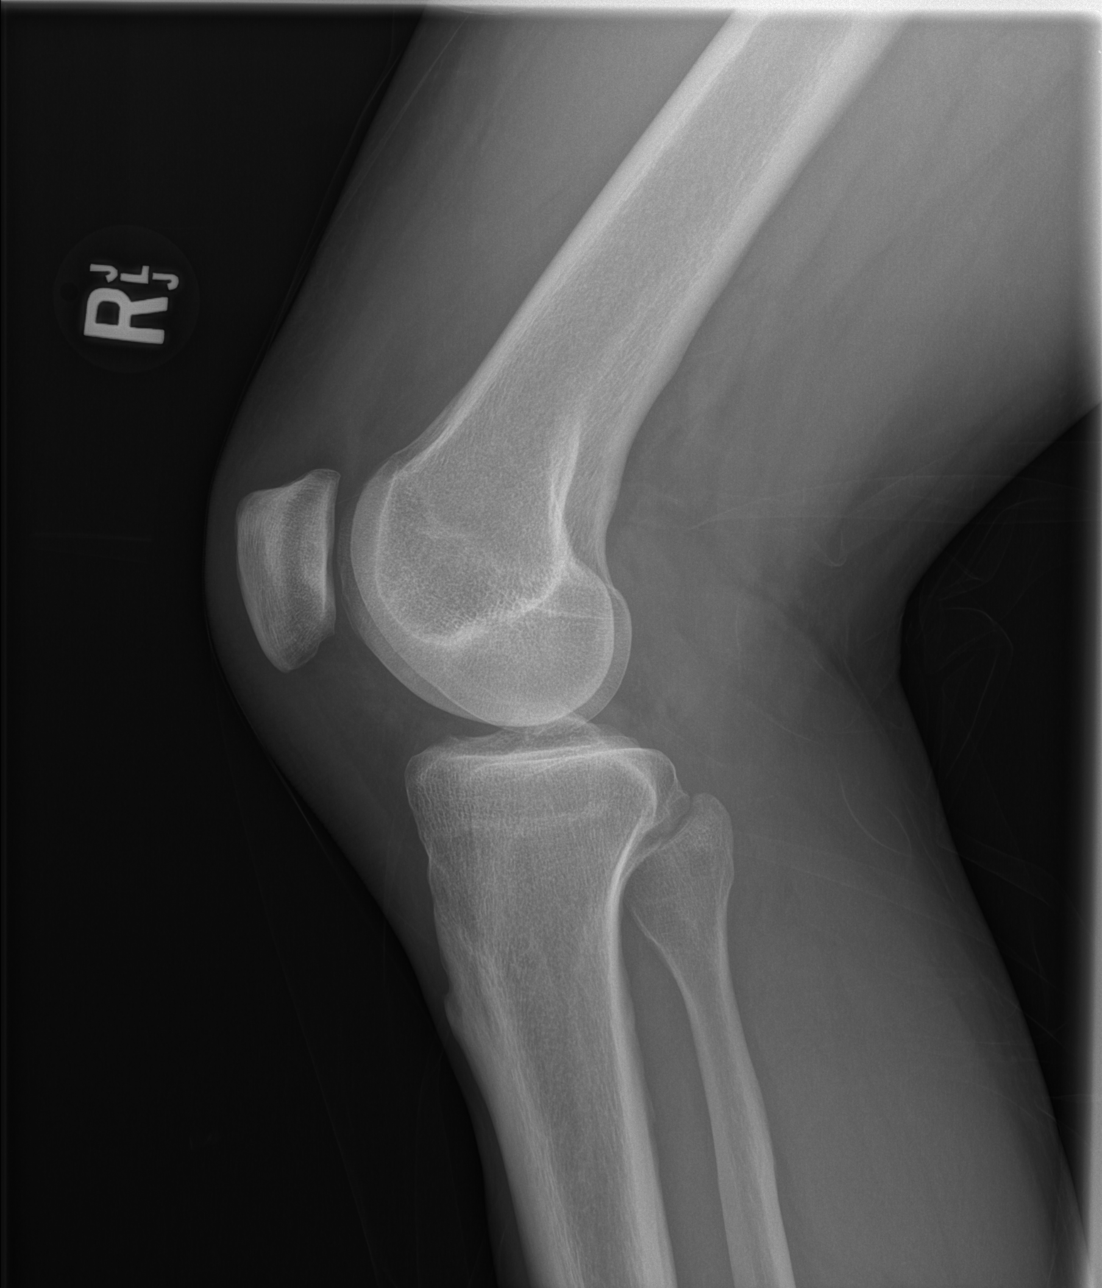

[4 of 4 positions shown; findings below may reference images not displayed]

FINDINGS: No evidence of fracture, dislocation, or joint effusion. No evidence
of arthropathy or other focal bone abnormality. Soft tissues are
unremarkable.
IMPRESSION: Negative.

## 2024-03-29 ENCOUNTER — Other Ambulatory Visit: Payer: Self-pay | Admitting: Physician Assistant

## 2024-03-29 DIAGNOSIS — H903 Sensorineural hearing loss, bilateral: Secondary | ICD-10-CM

## 2024-04-08 ENCOUNTER — Ambulatory Visit
Admission: RE | Admit: 2024-04-08 | Discharge: 2024-04-08 | Disposition: A | Discharge status: Home / Self Care | Source: Ambulatory Visit | Attending: Physician Assistant | Admitting: Physician Assistant

## 2024-04-08 DIAGNOSIS — H903 Sensorineural hearing loss, bilateral: Secondary | ICD-10-CM

## 2024-04-08 MED ORDER — GADOPICLENOL 0.5 MMOL/ML IV SOLN
9.0000 mL | Freq: Once | INTRAVENOUS | Status: AC | PRN
  Administered 2024-04-08: 9 mL via INTRAVENOUS
